# Patient Record
Sex: Male | Born: 2005 | Race: Asian | Hispanic: No | Marital: Single | Smoking: Never smoker
Health system: Southern US, Community
[De-identification: ages and names within clinical notes are randomized; demographics above are authoritative.]

---
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
## 2024-01-02 ENCOUNTER — Ambulatory Visit: Payer: BC Managed Care – PPO | Admitting: Family Medicine
